# Patient Record
Sex: Male | Born: 1995 | Race: Black or African American | Hispanic: No | Marital: Single | Smoking: Never smoker
Health system: Southern US, Community
[De-identification: ages and names within clinical notes are randomized; demographics above are authoritative.]

---
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
## 2016-06-27 ENCOUNTER — Encounter: Payer: Self-pay | Admitting: Emergency Medicine

## 2016-06-27 ENCOUNTER — Emergency Department
Admission: EM | Admit: 2016-06-27 | Discharge: 2016-06-27 | Disposition: A | Discharge status: Home / Self Care | Payer: BLUE CROSS/BLUE SHIELD | Attending: Emergency Medicine | Admitting: Emergency Medicine

## 2016-06-27 DIAGNOSIS — Y9367 Activity, basketball: Secondary | ICD-10-CM | POA: Diagnosis not present

## 2016-06-27 DIAGNOSIS — S0181XA Laceration without foreign body of other part of head, initial encounter: Secondary | ICD-10-CM

## 2016-06-27 DIAGNOSIS — Y998 Other external cause status: Secondary | ICD-10-CM | POA: Diagnosis not present

## 2016-06-27 DIAGNOSIS — S01511A Laceration without foreign body of lip, initial encounter: Secondary | ICD-10-CM

## 2016-06-27 DIAGNOSIS — Y929 Unspecified place or not applicable: Secondary | ICD-10-CM | POA: Insufficient documentation

## 2016-06-27 MED ORDER — OXYCODONE-ACETAMINOPHEN 5-325 MG PO TABS
1.0000 | ORAL_TABLET | Freq: Once | ORAL | Status: AC
  Administered 2016-06-27: 1 via ORAL
  Filled 2016-06-27: qty 1

## 2016-06-27 MED ORDER — LIDOCAINE-EPINEPHRINE-TETRACAINE (LET) SOLUTION
3.0000 mL | Freq: Once | NASAL | Status: AC
  Administered 2016-06-27: 3 mL via TOPICAL
  Filled 2016-06-27: qty 3

## 2016-06-27 MED ORDER — ETODOLAC 200 MG PO CAPS: 200.0000 mg | ORAL_CAPSULE | Freq: Three times a day (TID) | ORAL | 0 refills | Status: AC

## 2016-06-27 MED ORDER — LIDOCAINE HCL (PF) 1 % IJ SOLN
INTRAMUSCULAR | Status: AC
Start: 2016-06-27 — End: 2016-06-27
  Administered 2016-06-27: 09:00:00
  Filled 2016-06-27: qty 5
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
## 2016-06-27 NOTE — Discharge Instructions (Signed)
Please eat soft mechanical diet, please have sutures removed in 5-7 days
